# Patient Record
Sex: Male | Born: 1995
Health system: Southern US, Community
[De-identification: ages and names within clinical notes are randomized; demographics above are authoritative.]

## PROBLEM LIST (undated history)

## (undated) ENCOUNTER — Emergency Department (HOSPITAL_COMMUNITY): Admission: EM | Payer: BLUE CROSS/BLUE SHIELD | Source: Home / Self Care

## (undated) DIAGNOSIS — J45909 Unspecified asthma, uncomplicated: Secondary | ICD-10-CM

---
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
## 2016-12-03 ENCOUNTER — Encounter (HOSPITAL_COMMUNITY): Payer: Self-pay | Admitting: Emergency Medicine

## 2016-12-03 ENCOUNTER — Emergency Department (HOSPITAL_COMMUNITY)
Admission: EM | Admit: 2016-12-03 | Discharge: 2016-12-03 | Disposition: A | Discharge status: Home / Self Care | Payer: BLUE CROSS/BLUE SHIELD | Attending: Emergency Medicine | Admitting: Emergency Medicine

## 2016-12-03 DIAGNOSIS — J45909 Unspecified asthma, uncomplicated: Secondary | ICD-10-CM | POA: Insufficient documentation

## 2016-12-03 DIAGNOSIS — Z79899 Other long term (current) drug therapy: Secondary | ICD-10-CM | POA: Diagnosis not present

## 2016-12-03 DIAGNOSIS — M6282 Rhabdomyolysis: Secondary | ICD-10-CM | POA: Diagnosis not present

## 2016-12-03 DIAGNOSIS — R319 Hematuria, unspecified: Secondary | ICD-10-CM | POA: Diagnosis present

## 2016-12-03 HISTORY — DX: Unspecified asthma, uncomplicated: J45.909

## 2016-12-03 LAB — URINALYSIS, ROUTINE W REFLEX MICROSCOPIC
Glucose, UA: 100 mg/dL — AB
Ketones, ur: 15 mg/dL — AB
NITRITE: POSITIVE — AB
Specific Gravity, Urine: 1.01 (ref 1.005–1.030)
pH: 7 (ref 5.0–8.0)

## 2016-12-03 LAB — BASIC METABOLIC PANEL
Anion gap: 13 (ref 5–15)
BUN: 10 mg/dL (ref 6–20)
CO2: 21 mmol/L — ABNORMAL LOW (ref 22–32)
Calcium: 9.3 mg/dL (ref 8.9–10.3)
Chloride: 101 mmol/L (ref 101–111)
Creatinine, Ser: 1.58 mg/dL — ABNORMAL HIGH (ref 0.61–1.24)
GFR calc Af Amer: >60 mL/min (ref >60)
Glucose, Bld: 109 mg/dL — ABNORMAL HIGH (ref 65–99)
POTASSIUM: 4.1 mmol/L (ref 3.5–5.1)
SODIUM: 135 mmol/L (ref 135–145)

## 2016-12-03 LAB — CBC
HEMATOCRIT: 40.5 % (ref 39.0–52.0)
Hemoglobin: 13.3 g/dL (ref 13.0–17.0)
MCH: 23.9 pg — ABNORMAL LOW (ref 26.0–34.0)
MCHC: 32.8 g/dL (ref 30.0–36.0)
MCV: 72.7 fL — ABNORMAL LOW (ref 78.0–100.0)
PLATELETS: 234 10*3/uL (ref 150–400)
RBC: 5.57 MIL/uL (ref 4.22–5.81)
RDW: 15.6 % — AB (ref 11.5–15.5)
WBC: 11.7 10*3/uL — AB (ref 4.0–10.5)

## 2016-12-03 LAB — URINALYSIS, MICROSCOPIC (REFLEX)

## 2016-12-03 LAB — CK
Total CK: 3079 U/L — ABNORMAL HIGH (ref 49–397)
Total CK: 2937 U/L — ABNORMAL HIGH (ref 49–397)

## 2016-12-03 MED ORDER — SODIUM CHLORIDE 0.9 % IV BOLUS (SEPSIS)
1000.0000 mL | Freq: Once | INTRAVENOUS | Status: AC
  Administered 2016-12-03: 1000 mL via INTRAVENOUS

## 2016-12-03 NOTE — ED Notes (Signed)
Spoke with lab, they will add the CK to blood already in lab.

## 2016-12-03 NOTE — ED Notes (Signed)
Patient denies pain and is resting comfortably.  

## 2016-12-03 NOTE — ED Triage Notes (Signed)
Pt c/o blood in urine x 2 days. Denies abdominal pain, frequency, pain with urination or discharge.

## 2016-12-03 NOTE — ED Provider Notes (Signed)
[facility]   CSN: [id number] Arrival date & time: 12/03/16  0051     History   Chief Complaint Chief Complaint  Patient presents with  . Hematuria    HPI Henry RusselJakeem Violett is a 21 y.o. male.  Patient presents to the emergency department for evaluation of blood in his urine.  He reports that he just noticed the blood tonight.  Patient reports starting a new exercise regimen this week.  He has been running each of the days this week.  He reports the first couple of days he noticed that his urine was dark yellow, today there appeared to be blood in it.  When he runs he sometimes gets pain on his right side, but no continuous flank pain.  No nausea, vomiting.  Currently he is well without complaints.  He did not notice any urinary frequency or dysuria.  No urethral discharge.      Past Medical History:  Diagnosis Date  . Asthma     There are no active problems to display for this patient.   History reviewed. No pertinent surgical history.     Home Medications    Prior to Admission medications   Medication Sig Start Date End Date Taking? Authorizing Provider  albuterol (PROVENTIL HFA;VENTOLIN HFA) 108 (90 BASE) MCG/ACT inhaler Inhale 1-2 puffs into the lungs every 6 (six) hours as needed for wheezing or shortness of breath.   Yes [provider]    Family History No family history on file.  Social History Social History  Substance Use Topics  . Smoking status: Never Smoker  . Smokeless tobacco: Not on file  . Alcohol use Yes     Allergies   Fruit & vegetable daily [nutritional supplements]   Review of Systems Review of Systems  Genitourinary: Positive for hematuria. Negative for dysuria and frequency.  All other systems reviewed and are negative.    Physical Exam Updated Vital Signs BP 112/66   Pulse 77   Temp 97.6 F (36.4 C) (Oral)   Resp 16   SpO2 100%   Physical Exam   Constitutional: He is oriented to person, place, and time. He appears well-developed and well-nourished. No distress.  HENT:  Head: Normocephalic and atraumatic.  Right Ear: Hearing normal.  Left Ear: Hearing normal.  Nose: Nose normal.  Mouth/Throat: Oropharynx is clear and moist and mucous membranes are normal.  Eyes: Pupils are equal, round, and reactive to light. Conjunctivae and EOM are normal.  Neck: Normal range of motion. Neck supple.  Cardiovascular: Regular rhythm, S1 normal and S2 normal.  Exam reveals no gallop and no friction rub.   No murmur heard. Pulmonary/Chest: Effort normal and breath sounds normal. No respiratory distress. He exhibits no tenderness.  Abdominal: Soft. Normal appearance and bowel sounds are normal. There is no hepatosplenomegaly. There is no tenderness. There is no rebound, no guarding, no tenderness at McBurney's point and negative Murphy's sign. No hernia.  Musculoskeletal: Normal range of motion.  Neurological: He is alert and oriented to person, place, and time. He has normal strength. No cranial nerve deficit or sensory deficit. Coordination normal. GCS eye subscore is 4. GCS verbal subscore is 5. GCS motor subscore is 6.  Skin: Skin is warm, dry and intact. No rash noted. No cyanosis.  Psychiatric: He has a normal mood and affect. His speech is normal and behavior is normal. Thought content normal.  Nursing note and vitals reviewed.    ED Treatments /  Results  Labs (all labs ordered are listed, but only abnormal results are displayed) Labs Reviewed  URINALYSIS, ROUTINE W REFLEX MICROSCOPIC - Abnormal; Notable for the following:       Result Value   Color, Urine RED (*)    APPearance CLOUDY (*)    Glucose, UA 100 (*)    Hgb urine dipstick LARGE (*)    Bilirubin Urine LARGE (*)    Ketones, ur 15 (*)    Protein, ur >300 (*)    Nitrite POSITIVE (*)    Leukocytes, UA MODERATE (*)    All other components within normal limits  BASIC METABOLIC  PANEL - Abnormal; Notable for the following:    CO2 21 (*)    Glucose, Bld 109 (*)    Creatinine, Ser 1.58 (*)    All other components within normal limits  CBC - Abnormal; Notable for the following:    WBC 11.7 (*)    MCV 72.7 (*)    MCH 23.9 (*)    RDW 15.6 (*)    All other components within normal limits  URINALYSIS, MICROSCOPIC (REFLEX) - Abnormal; Notable for the following:    Bacteria, UA FEW (*)    Squamous Epithelial / LPF PRESENT (*)    All other components within normal limits  CK - Abnormal; Notable for the following:    Total CK 3,079 (*)    All other components within normal limits  CK - Abnormal; Notable for the following:    Total CK 2,937 (*)    All other components within normal limits  GC/CHLAMYDIA PROBE AMP () NOT AT [facility]    EKG  EKG Interpretation None       Radiology No results found.  Procedures Procedures (including critical care time)  Medications Ordered in ED Medications  sodium chloride 0.9 % bolus 1,000 mL (0 mLs Intravenous Stopped 12/03/16 0347)    Followed by  sodium chloride 0.9 % bolus 1,000 mL (0 mLs Intravenous Stopped 12/03/16 0515)  sodium chloride 0.9 % bolus 1,000 mL (1,000 mLs Intravenous New Bag/Given 12/03/16 0515)     Initial Impression / Assessment and Plan / ED Course  I have reviewed the triage vital signs and the nursing notes.  Pertinent labs & imaging results that were available during my care of the patient were reviewed by me and considered in my medical decision making (see chart for details).     Patient resents to the ER with concerns over possible blood in his urine.  Initial urinalysis showed large blood but no red blood cells, which raise concern for rhabdomyolysis.  Further discussion with the patient does reveal that he has been actively exercising this week, running in preparation for some events.  CPK was ordered and it did reveal a total CPK of 3,079.  He did have slight elevation of his  creatinine at 1.58.  Patient was given 2 L of normal saline solution and repeat CPK was 2,937.  Patient is tolerating oral intake.  As his rhabdomyolysis is not increasing.  It is felt that he can continue oral hydration at home.  He was, however, given 1/3 L of normal saline solution prior to discharge.  Patient was counseled not to exercise for 1 week.  Final Clinical Impressions(s) / ED Diagnoses   Final diagnoses:  Non-traumatic rhabdomyolysis    New Prescriptions New Prescriptions   No medications on file     Gilda Crease, MD 12/03/16 [phone]

## 2016-12-03 NOTE — Discharge Instructions (Signed)
No exercise for 1 week.  Increase your oral intake of fluid over the next week.  If you have any severe muscle pain or decrease in your urine output, return to the ER immediately.

## 2017-01-06 DIAGNOSIS — Z23 Encounter for immunization: Secondary | ICD-10-CM | POA: Diagnosis not present

## 2017-02-23 ENCOUNTER — Encounter (INDEPENDENT_AMBULATORY_CARE_PROVIDER_SITE_OTHER): Payer: Self-pay | Admitting: *Deleted

## 2017-02-23 ENCOUNTER — Telehealth: Payer: Self-pay | Admitting: Pharmacist

## 2017-02-23 DIAGNOSIS — Z006 Encounter for examination for normal comparison and control in clinical research program: Secondary | ICD-10-CM

## 2017-02-23 NOTE — Progress Notes (Signed)
Jeremiah Torres was here today [facility] see [facility] He currently is in a monogamous relationship with his partner and did not meet any of the other criteria to qualify for the study. He was referred to the PrEP clinic and Cassie scheduled him an appointment for next week. .Marland Kitchen

## 2017-02-23 NOTE — Telephone Encounter (Signed)
Jeremiah SnareJakeem came in with his partner today to be screened for our HPTN PrEP study.  They are in a monogomous relationship, so they do not qualify.  I was asked to speak to them about PrEP.  I discussed our PrEP program here with them.  They had to get to class, so they will come back next Tuesday ~11am to start the process and get labs. They are both insured.

## 2017-02-23 NOTE — Telephone Encounter (Signed)
Thanks again Cassie!

## 2017-03-02 ENCOUNTER — Ambulatory Visit: Payer: BLUE CROSS/BLUE SHIELD

## 2017-09-06 ENCOUNTER — Encounter (HOSPITAL_COMMUNITY): Payer: Self-pay | Admitting: Emergency Medicine

## 2017-09-06 ENCOUNTER — Ambulatory Visit (HOSPITAL_COMMUNITY)
Admission: EM | Admit: 2017-09-06 | Discharge: 2017-09-06 | Disposition: A | Discharge status: Home / Self Care | Payer: Self-pay | Attending: Family Medicine | Admitting: Family Medicine

## 2017-09-06 DIAGNOSIS — R0602 Shortness of breath: Secondary | ICD-10-CM | POA: Insufficient documentation

## 2017-09-06 DIAGNOSIS — J45909 Unspecified asthma, uncomplicated: Secondary | ICD-10-CM | POA: Insufficient documentation

## 2017-09-06 DIAGNOSIS — R0789 Other chest pain: Secondary | ICD-10-CM | POA: Insufficient documentation

## 2017-09-06 DIAGNOSIS — Z79899 Other long term (current) drug therapy: Secondary | ICD-10-CM | POA: Insufficient documentation

## 2017-09-06 DIAGNOSIS — Z7983 Long term (current) use of bisphosphonates: Secondary | ICD-10-CM | POA: Insufficient documentation

## 2017-09-06 DIAGNOSIS — R001 Bradycardia, unspecified: Secondary | ICD-10-CM | POA: Insufficient documentation

## 2017-09-06 DIAGNOSIS — R002 Palpitations: Secondary | ICD-10-CM | POA: Insufficient documentation

## 2017-09-06 LAB — POCT I-STAT, CHEM 8
BUN: 10 mg/dL (ref 6–20)
CHLORIDE: 102 mmol/L (ref 98–111)
Calcium, Ion: 1.19 mmol/L (ref 1.15–1.40)
Creatinine, Ser: 1.2 mg/dL (ref 0.61–1.24)
Glucose, Bld: 105 mg/dL — ABNORMAL HIGH (ref 70–99)
HCT: 46 % (ref 39.0–52.0)
HEMOGLOBIN: 15.6 g/dL (ref 13.0–17.0)
Potassium: 3.9 mmol/L (ref 3.5–5.1)
Sodium: 141 mmol/L (ref 135–145)
TCO2: 27 mmol/L (ref 22–32)

## 2017-09-06 LAB — TSH: TSH: 0.69 u[IU]/mL (ref 0.350–4.500)

## 2017-09-06 MED ORDER — RANITIDINE HCL 150 MG PO CAPS: 150.0000 mg | ORAL_CAPSULE | Freq: Every day | ORAL | 0 refills | Status: AC

## 2017-09-06 NOTE — ED Triage Notes (Signed)
PT reports for 3 weeks he has has intermittent SOB, chest pain, and palipitations. Happens most often when he lays down. No chest pain at this time.

## 2017-09-06 NOTE — ED Notes (Signed)
Pt asked if he could leave and go to urgent care. I told him that I cannot advise him to leave. Pt looked up nearest urgent care and stated he was leaving.

## 2017-09-06 NOTE — ED Provider Notes (Signed)
[facility]    CSN: [id number] Arrival date & time: 09/06/17  1932     History   Chief Complaint Chief Complaint  Patient presents with  . Shortness of Breath    HPI Jeremiah Torres is a 22 y.o. male.   22 year old male with history of asthma comes in for 3 week history of intermittent shortness of breath, chest pain, palpitations.  States symptoms can happen separately, or altogether, can last anywhere from 5 to 30 minutes.  States chest pain location is different every time, can be right chest, left lower chest, no obvious pattern.  Denies any aggravating or alleviating factor, though seems to notice symptoms more when lying down.  Patient states has history of exercise/exertional induced asthma, but has not had increased activity, and has not felt like he needed his albuterol inhaler.  He has mild nasal congestion without rhinorrhea, cough, sore throat.  Denies fever, chills, night sweats.  Occasional nausea with abdominal pain or vomiting.  Does experience acid reflux symptoms including burning sensation to the abdomen that can radiate up to chest.  Denies weakness, dizziness, syncope.  Patient wonders if symptoms could be due to anxiety, as currently increased stress in life including work, school, finances.  He denies caffeine use, alcohol use, illicit drug use.  Never smoker. Denies current chest pain.      Past Medical History:  Diagnosis Date  . Asthma     There are no active problems to display for this patient.   History reviewed. No pertinent surgical history.     Home Medications    Prior to Admission medications   Medication Sig Start Date End Date Taking? Authorizing Provider  albuterol (PROVENTIL HFA;VENTOLIN HFA) 108 (90 BASE) MCG/ACT inhaler Inhale 1-2 puffs into the lungs every 6 (six) hours as needed for wheezing or shortness of breath.    [provider]  ranitidine (ZANTAC) 150 MG capsule Take 1 capsule (150 mg total) by mouth daily.  09/06/17   Belinda FisherYu, Amy V, PA-C    Family History No family history on file.  Social History Social History   Tobacco Use  . Smoking status: Never Smoker  Substance Use Topics  . Alcohol use: Yes  . Drug use: No     Allergies   Fruit & vegetable daily [nutritional supplements]   Review of Systems Review of Systems  Reason unable to perform ROS: See HPI as above.     Physical Exam Triage Vital Signs ED Triage Vitals  Enc Vitals Group     BP 09/06/17 1956 139/77     Pulse Rate 09/06/17 1956 78     Resp 09/06/17 1956 16     Temp 09/06/17 1956 98.4 F (36.9 C)     Temp Source 09/06/17 1956 Oral     SpO2 09/06/17 1956 100 %     Weight 09/06/17 1957 197 lb (89.4 kg)     Height --      Head Circumference --      Peak Flow --      Pain Score 09/06/17 1957 5     Pain Loc --      Pain Edu? --      Excl. in GC? --    No data found.  Updated Vital Signs BP 139/77   Pulse 78   Temp 98.4 F (36.9 C) (Oral)   Resp 16   Wt 197 lb (89.4 kg)   SpO2 100%   BMI 25.99 kg/m  Physical Exam  Constitutional: He is oriented to person, place, and time. He appears well-developed and well-nourished.  Non-toxic appearance. He does not appear ill. No distress.  Patient speaking in full sentences without difficulty.  HENT:  Head: Normocephalic and atraumatic.  Right Ear: Tympanic membrane, external ear and ear canal normal. Tympanic membrane is not erythematous and not bulging.  Left Ear: Tympanic membrane, external ear and ear canal normal. Tympanic membrane is not erythematous and not bulging.  Nose: Nose normal. Right sinus exhibits no maxillary sinus tenderness and no frontal sinus tenderness. Left sinus exhibits no maxillary sinus tenderness and no frontal sinus tenderness.  Mouth/Throat: Uvula is midline, oropharynx is clear and moist and mucous membranes are normal.  Eyes: Pupils are equal, round, and reactive to light. Conjunctivae are normal.  Neck: Normal range of motion.  Neck supple.  Cardiovascular: Normal rate, regular rhythm and normal heart sounds. Exam reveals no gallop and no friction rub.  No murmur heard. Pulmonary/Chest: Effort normal and breath sounds normal. No accessory muscle usage. No respiratory distress. He has no decreased breath sounds. He has no wheezes. He has no rhonchi. He has no rales. He exhibits no tenderness.  No tenderness to palpation of chest wall.   Abdominal: Soft. Bowel sounds are normal. He exhibits no distension. There is no tenderness. There is no rebound and no guarding.  Musculoskeletal:       Right lower leg: He exhibits no edema.       Left lower leg: He exhibits no edema.  Lymphadenopathy:    He has no cervical adenopathy.  Neurological: He is alert and oriented to person, place, and time.  Skin: Skin is warm and dry.  Psychiatric: He has a normal mood and affect. His behavior is normal. Judgment normal.   UC Treatments / Results  Labs (all labs ordered are listed, but only abnormal results are displayed) Labs Reviewed  POCT I-STAT, CHEM 8 - Abnormal; Notable for the following components:      Result Value   Glucose, Bld 105 (*)    All other components within normal limits  TSH    EKG None  Radiology No results found.  Procedures Procedures (including critical care time)  Medications Ordered in UC Medications - No data to display  Initial Impression / Assessment and Plan / UC Course  I have reviewed the triage vital signs and the nursing notes.  Pertinent labs & imaging results that were available during my care of the patient were reviewed by me and considered in my medical decision making (see chart for details).    No alarming signs on exam.  EKG sinus bradycardia, beats per minute, no acute ST changes.  I-STAT within normal limits.  TSH pending.  Will have patient start Zantac for possible acid reflux causing symptoms.  Otherwise follow-up with PCP for further evaluation and management.  Return  precautions given.  Patient expresses understanding and agrees to plan.  Patient discharged in stable condition, pending TSH result.  Case discussed with Dr Delton See, who agrees to plan.  Final Clinical Impressions(s) / UC Diagnoses   Final diagnoses:  Atypical chest pain  Shortness of breath  Palpitations    ED Prescriptions    Medication Sig Dispense Auth. Provider   ranitidine (ZANTAC) 150 MG capsule Take 1 capsule (150 mg total) by mouth daily. 15 capsule Threasa Alpha, [place] 09/06/17 2117

## 2017-09-06 NOTE — Discharge Instructions (Addendum)
No alarming signs on exam. Your EKG and lab work was normal. Please start zantac for acid reflux, which sometimes can cause some of these symptoms. Keep hydrated, your urine should be clear to pale yellow in color. Follow up with PCP for further workup if symptoms continues. If experiencing worsening symptoms, worsening chest pain/shortness of breath, weakness, dizziness, passing out, one sided leg swelling, go to the emergency department for further evaluation needed.

## 2017-10-25 ENCOUNTER — Encounter (HOSPITAL_COMMUNITY): Payer: Self-pay

## 2017-10-25 ENCOUNTER — Ambulatory Visit (HOSPITAL_COMMUNITY)
Admission: EM | Admit: 2017-10-25 | Discharge: 2017-10-25 | Disposition: A | Discharge status: Home / Self Care | Payer: Self-pay | Attending: Family Medicine | Admitting: Family Medicine

## 2017-10-25 DIAGNOSIS — J45909 Unspecified asthma, uncomplicated: Secondary | ICD-10-CM | POA: Insufficient documentation

## 2017-10-25 DIAGNOSIS — Z79899 Other long term (current) drug therapy: Secondary | ICD-10-CM | POA: Insufficient documentation

## 2017-10-25 DIAGNOSIS — Z113 Encounter for screening for infections with a predominantly sexual mode of transmission: Secondary | ICD-10-CM | POA: Insufficient documentation

## 2017-10-25 NOTE — Discharge Instructions (Signed)
We are screening you for gonorrhea, chlamydia, trichomonas, HIV and syphilis These results should come back the end of the week and you should expect a call from us if anything is abnormal  Please follow-up if developing symptoms or any concerns

## 2017-10-25 NOTE — ED Triage Notes (Signed)
Pt presents for STD Testing, no complaints of any symptoms.

## 2017-10-25 NOTE — ED Provider Notes (Signed)
[facility]    CSN: [id number] Arrival date & time: 10/25/17  1700     History   Chief Complaint Chief Complaint  Patient presents with  . STD Testing    HPI Jeremiah Torres is a 21 y.o. male history of asthma presenting today for STD screening.  Patient states that he participates in male male sexual intercourse and would like to be screened for STDs.  He does not have any known exposures.  He denies any symptoms.  Denies penile discharge, dysuria, abdominal pain, fever, nausea, vomiting.  Denies any rashes.  Denies coughing or chest discomfort.  Denies sore throat.  Requesting swab rectally and orally.  HPI  Past Medical History:  Diagnosis Date  . Asthma     There are no active problems to display for this patient.   History reviewed. No pertinent surgical history.     Home Medications    Prior to Admission medications   Medication Sig Start Date End Date Taking? Authorizing Provider  albuterol (PROVENTIL HFA;VENTOLIN HFA) 108 (90 BASE) MCG/ACT inhaler Inhale 1-2 puffs into the lungs every 6 (six) hours as needed for wheezing or shortness of breath.    [provider]  ranitidine (ZANTAC) 150 MG capsule Take 1 capsule (150 mg total) by mouth daily. 09/06/17   Belinda Fisher, PA-C    Family History History reviewed. No pertinent family history.  Social History Social History   Tobacco Use  . Smoking status: Never Smoker  Substance Use Topics  . Alcohol use: Yes  . Drug use: No     Allergies   Fruit & vegetable daily [nutritional supplements]   Review of Systems Review of Systems  Constitutional: Negative for fever.  HENT: Negative for sore throat.   Respiratory: Negative for shortness of breath.   Cardiovascular: Negative for chest pain.  Gastrointestinal: Negative for abdominal pain, nausea and vomiting.  Genitourinary: Negative for difficulty urinating, discharge, dysuria, frequency, penile pain, penile swelling, scrotal swelling and  testicular pain.  Skin: Negative for rash.  Neurological: Negative for dizziness, light-headedness and headaches.     Physical Exam Triage Vital Signs ED Triage Vitals  Enc Vitals Group     BP 10/25/17 1720 (!) 151/68     Pulse Rate 10/25/17 1720 60     Resp 10/25/17 1720 20     Temp 10/25/17 1720 98 F (36.7 C)     Temp Source 10/25/17 1720 Oral     SpO2 10/25/17 1720 100 %     Weight --      Height --      Head Circumference --      Peak Flow --      Pain Score 10/25/17 1719 0     Pain Loc --      Pain Edu? --      Excl. in GC? --    No data found.  Updated Vital Signs BP (!) 151/68 (BP Location: Right Arm)   Pulse 60   Temp 98 F (36.7 C) (Oral)   Resp 20   SpO2 100%   Visual Acuity Right Eye Distance:   Left Eye Distance:   Bilateral Distance:    Right Eye Near:   Left Eye Near:    Bilateral Near:     Physical Exam  Constitutional: He is oriented to person, place, and time. He appears well-developed and well-nourished.  No acute distress  HENT:  Head: Normocephalic and atraumatic.  Nose: Nose normal.  Eyes: Conjunctivae are  normal.  Neck: Neck supple.  Cardiovascular: Normal rate.  Pulmonary/Chest: Effort normal. No respiratory distress.  Breathing comfortably at rest  Abdominal: Soft. He exhibits no distension. There is no tenderness.  Nontender to light deep palpation throughout abdomen  Musculoskeletal: Normal range of motion.  Neurological: He is alert and oriented to person, place, and time.  Skin: Skin is warm and dry.  Psychiatric: He has a normal mood and affect.  Nursing note and vitals reviewed.    UC Treatments / Results  Labs (all labs ordered are listed, but only abnormal results are displayed) Labs Reviewed  HIV ANTIBODY (ROUTINE TESTING W REFLEX)  RPR  URINE CYTOLOGY ANCILLARY ONLY  CYTOLOGY, (ORAL, ANAL, URETHRAL) ANCILLARY ONLY  CYTOLOGY, (ORAL, ANAL, URETHRAL) ANCILLARY ONLY    EKG None  Radiology No results  found.  Procedures Procedures (including critical care time)  Medications Ordered in UC Medications - No data to display  Initial Impression / Assessment and Plan / UC Course  I have reviewed the triage vital signs and the nursing notes.  Pertinent labs & imaging results that were available during my care of the patient were reviewed by me and considered in my medical decision making (see chart for details).     We will screen patient for HIV, syphilis, will screen for GC's/chlamydia/trichomonas with urine, rectal swab and oral swab.  Will call patient with results and provide treatment if needed.  Will defer treatment until results given patient asymptomatic.Discussed strict return precautions. Patient verbalized understanding and is agreeable with plan.  Final Clinical Impressions(s) / UC Diagnoses   Final diagnoses:  Screen for STD (sexually transmitted disease)     Discharge Instructions     We are screening you for gonorrhea, chlamydia, trichomonas, HIV and syphilis These results should come back the end of the week and you should expect a call from us if anything is abnormal  Please follow-up if developing symptoms or any concerns   ED Prescriptions    None     Controlled Substance Prescriptions [name] Controlled Substance Registry consulted? Not Applicable   Lew DawesWieters, Barry Culverhouse C, [place]PA-C 10/25/17 1806

## 2017-10-26 LAB — CYTOLOGY, (ORAL, ANAL, URETHRAL) ANCILLARY ONLY
Chlamydia: NEGATIVE
Chlamydia: NEGATIVE
Neisseria Gonorrhea: NEGATIVE
Neisseria Gonorrhea: NEGATIVE
TRICH (WINDOWPATH): NEGATIVE
TRICH (WINDOWPATH): NEGATIVE

## 2017-10-26 LAB — HIV ANTIBODY (ROUTINE TESTING W REFLEX): HIV SCREEN 4TH GENERATION: NONREACTIVE

## 2017-10-26 LAB — RPR: RPR: NONREACTIVE

## 2017-10-26 LAB — URINE CYTOLOGY ANCILLARY ONLY
CHLAMYDIA, DNA PROBE: NEGATIVE
NEISSERIA GONORRHEA: NEGATIVE
Trichomonas: NEGATIVE

## 2018-04-07 ENCOUNTER — Ambulatory Visit: Payer: Self-pay | Admitting: Physician Assistant

## 2018-04-07 NOTE — Progress Notes (Deleted)
Jeremiah Torres is a 22 y.o. male here to Establish Care.  I acted as a Neurosurgeon for [employer], PA-C [employer], LPN  History of Present Illness:   No chief complaint on file.   Acute Concerns:   Chronic Issues:   Health Maintenance: Immunizations -- *** Colonoscopy -- *** Mammogram -- *** PAP -- *** Bone Density -- *** PSA -- *** Diet -- *** Caffeine intake -- *** Sleep habits -- *** Exercise -- *** Weight --    Mood -- *** Alcohol -- *** Tobacco -- ***  No flowsheet data found.  No flowsheet data found.   Other providers/specialists: Patient Care Team: System, Provider Not In as PCP - General   Past Medical History:  Diagnosis Date  . Asthma      Social History   Socioeconomic History  . Marital status: Single    Spouse name: Not on file  . Number of children: Not on file  . Years of education: Not on file  . Highest education level: Not on file  Occupational History  . Not on file  Social Needs  . Financial resource strain: Not on file  . Food insecurity:    Worry: Not on file    Inability: Not on file  . Transportation needs:    Medical: Not on file    Non-medical: Not on file  Tobacco Use  . Smoking status: Never Smoker  Substance and Sexual Activity  . Alcohol use: Yes  . Drug use: No  . Sexual activity: Not on file  Lifestyle  . Physical activity:    Days per week: Not on file    Minutes per session: Not on file  . Stress: Not on file  Relationships  . Social connections:    Talks on phone: Not on file    Gets together: Not on file    Attends religious service: Not on file    Active member of club or organization: Not on file    Attends meetings of clubs or organizations: Not on file    Relationship status: Not on file  . Intimate partner violence:    Fear of current or ex partner: Not on file    Emotionally abused: Not on file    Physically abused: Not on file    Forced sexual activity: Not on file  Other Topics  Concern  . Not on file  Social History Narrative  . Not on file    No past surgical history on file.  No family history on file.  Allergies  Allergen Reactions  . Fruit & Vegetable Daily [Nutritional Supplements] Anaphylaxis and Hives    "Strawberries"     Current Medications:   Current Outpatient Medications:  .  albuterol (PROVENTIL HFA;VENTOLIN HFA) 108 (90 BASE) MCG/ACT inhaler, Inhale 1-2 puffs into the lungs every 6 (six) hours as needed for wheezing or shortness of breath., Disp: , Rfl:  .  ranitidine (ZANTAC) 150 MG capsule, Take 1 capsule (150 mg total) by mouth daily., Disp: 15 capsule, Rfl: 0   Review of Systems:   ROS  Vitals:   There were no vitals filed for this visit.    There is no height or weight on file to calculate BMI.  Physical Exam:   Physical Exam  Results for orders placed or performed during the hospital encounter of 10/25/17  HIV Antibody (routine testing w rflx)  Result Value Ref Range   HIV Screen 4th Generation wRfx Non Reactive Non Reactive  RPR  Result Value Ref Range   RPR Ser Ql Non Reactive Non Reactive  Urine cytology ancillary only  Result Value Ref Range   Chlamydia Negative    Neisseria gonorrhea Negative    Trichomonas Negative   Cytology (oral, anal, urethral) ancillary only  Result Value Ref Range   Chlamydia Negative    Neisseria gonorrhea Negative    Trichomonas Negative   Cytology (oral, anal, urethral) ancillary only  Result Value Ref Range   Chlamydia Negative    Neisseria gonorrhea Negative    Trichomonas Negative     Assessment and Plan:   There are no diagnoses linked to this encounter.  . Reviewed expectations re: course of current medical issues. . Discussed self-management of symptoms. . Outlined signs and symptoms indicating need for more acute intervention. . Patient verbalized understanding and all questions were answered. . See orders for this visit as documented in the electronic medical  record. . Patient received an After-Visit Summary.  ***  Jarold Motto, PA-C

## 2018-04-27 ENCOUNTER — Other Ambulatory Visit: Payer: Self-pay

## 2018-04-27 ENCOUNTER — Ambulatory Visit (INDEPENDENT_AMBULATORY_CARE_PROVIDER_SITE_OTHER): Payer: BLUE CROSS/BLUE SHIELD | Admitting: Family Medicine

## 2018-04-27 ENCOUNTER — Encounter: Payer: Self-pay | Admitting: Family Medicine

## 2018-04-27 ENCOUNTER — Other Ambulatory Visit (HOSPITAL_COMMUNITY)
Admission: RE | Admit: 2018-04-27 | Discharge: 2018-04-27 | Disposition: A | Discharge status: Home / Self Care | Payer: BLUE CROSS/BLUE SHIELD | Source: Ambulatory Visit | Attending: Family Medicine | Admitting: Family Medicine

## 2018-04-27 VITALS — BP 130/80 | HR 67 | Temp 98.6°F | Ht 72.0 in | Wt 188.4 lb

## 2018-04-27 DIAGNOSIS — Z0000 Encounter for general adult medical examination without abnormal findings: Secondary | ICD-10-CM | POA: Diagnosis not present

## 2018-04-27 DIAGNOSIS — Z91018 Allergy to other foods: Secondary | ICD-10-CM | POA: Diagnosis not present

## 2018-04-27 DIAGNOSIS — Z113 Encounter for screening for infections with a predominantly sexual mode of transmission: Secondary | ICD-10-CM

## 2018-04-27 DIAGNOSIS — R7301 Impaired fasting glucose: Secondary | ICD-10-CM

## 2018-04-27 DIAGNOSIS — Z23 Encounter for immunization: Secondary | ICD-10-CM | POA: Diagnosis not present

## 2018-04-27 LAB — BASIC METABOLIC PANEL
BUN: 13 mg/dL (ref 6–23)
CO2: 29 mEq/L (ref 19–32)
Calcium: 9.6 mg/dL (ref 8.4–10.5)
Chloride: 104 mEq/L (ref 96–112)
Creatinine, Ser: 1.08 mg/dL (ref 0.40–1.50)
GFR: 102.93 mL/min (ref >60.00)
GLUCOSE: 103 mg/dL — AB (ref 70–99)
POTASSIUM: 4.4 meq/L (ref 3.5–5.1)
SODIUM: 139 meq/L (ref 135–145)

## 2018-04-27 LAB — LIPID PANEL
CHOLESTEROL: 161 mg/dL (ref 0–200)
HDL: 52.1 mg/dL (ref >39.00)
LDL Cholesterol: 96 mg/dL (ref 0–99)
NONHDL: 108.45
Total CHOL/HDL Ratio: 3
Triglycerides: 60 mg/dL (ref 0.0–149.0)
VLDL: 12 mg/dL (ref 0.0–40.0)

## 2018-04-27 LAB — ALT: ALT: 12 U/L (ref 0–53)

## 2018-04-27 LAB — AST: AST: 17 U/L (ref 0–37)

## 2018-04-27 NOTE — Patient Instructions (Signed)

## 2018-04-27 NOTE — Progress Notes (Signed)
Jeremiah Torres is a 23 y.o. male  Chief Complaint  Patient presents with  . Establish Care    est care/ CPE-- fasting/ referral to allergist?/ wants flu shot    HPI: Jeremiah Torres is a 23 y.o. male here as a new patient to establish care with our office and for his annual CPE, fasting labs. He also requests STI screening, no known exposure or symptoms.  Pt would like a flu vaccine today. Pt also requests referral to allergy. He has testing in 2013 or 2014 for his food allergies (strawberries, peas) as well as environmental allergy to oak trees. He would like to possibly be retested especially for his food allergies to see if he has "outgrown" them.    Past Medical History:  Diagnosis Date  . Asthma     No past surgical history on file.  Social History   Socioeconomic History  . Marital status: Single    Spouse name: Not on file  . Number of children: Not on file  . Years of education: Not on file  . Highest education level: Not on file  Occupational History  . Not on file  Social Needs  . Financial resource strain: Not on file  . Food insecurity:    Worry: Not on file    Inability: Not on file  . Transportation needs:    Medical: Not on file    Non-medical: Not on file  Tobacco Use  . Smoking status: Never Smoker  . Smokeless tobacco: Never Used  . Tobacco comment: second hand smoke  Substance and Sexual Activity  . Alcohol use: Yes    Comment: socially  . Drug use: No  . Sexual activity: Not on file  Lifestyle  . Physical activity:    Days per week: Not on file    Minutes per session: Not on file  . Stress: Not on file  Relationships  . Social connections:    Talks on phone: Not on file    Gets together: Not on file    Attends religious service: Not on file    Active member of club or organization: Not on file    Attends meetings of clubs or organizations: Not on file    Relationship status: Not on file  . Intimate partner violence:    Fear of  current or ex partner: Not on file    Emotionally abused: Not on file    Physically abused: Not on file    Forced sexual activity: Not on file  Other Topics Concern  . Not on file  Social History Narrative  . Not on file    Family History  Problem Relation Age of Onset  . Hypertension Father   . Diabetes Maternal Grandmother      Immunization History  Administered Date(s) Administered  . Tdap 04/09/2017    Outpatient Encounter Medications as of 04/27/2018  Medication Sig  . albuterol (PROVENTIL HFA;VENTOLIN HFA) 108 (90 BASE) MCG/ACT inhaler Inhale 1-2 puffs into the lungs every 6 (six) hours as needed for wheezing or shortness of breath.  . EPINEPHrine 0.3 mg/0.3 mL IJ SOAJ injection Inject into the muscle.  . ranitidine (ZANTAC) 150 MG capsule Take 1 capsule (150 mg total) by mouth daily.   No facility-administered encounter medications on file as of 04/27/2018.      ROS: Gen: no fever, chills  Skin: no rash, itching ENT: no ear pain, ear drainage, nasal congestion, rhinorrhea, sinus pressure, sore throat Eyes: no blurry vision, double  vision Resp: no cough, wheeze,SOB CV: no CP, palpitations, LE edema,  GI: no heartburn, n/v/d/c, abd pain GU: no dysuria, urgency, frequency, hematuria; no testicular swelling or masses MSK: no joint pain, myalgias, back pain Neuro: no dizziness, headache, weakness Psych: no depression, anxiety, insomnia   Allergies  Allergen Reactions  . Fruit & Vegetable Daily [Nutritional Supplements] Anaphylaxis and Hives    "Strawberries"  . [facility]  [Quercus Robur]   . Pea Rash  . Strawberry Extract Rash    BP 130/80   Pulse 67   Temp 98.6 F (37 C) (Oral)   Ht 6' (1.829 m)   Wt 188 lb 6.4 oz (85.5 kg)   SpO2 97%   BMI 25.55 kg/m   Physical Exam  Constitutional: He is oriented to person, place, and time. He appears well-developed and well-nourished. No distress.  HENT:  Head: Normocephalic and atraumatic.  Right Ear: Tympanic  membrane and ear canal normal.  Left Ear: Tympanic membrane and ear canal normal.  Nose: Nose normal.  Mouth/Throat: Oropharynx is clear and moist and mucous membranes are normal.  Neck: Neck supple. No thyromegaly present.  Cardiovascular: Normal rate, regular rhythm and normal heart sounds.  No murmur heard. Pulmonary/Chest: Effort normal and breath sounds normal. No respiratory distress. He has no wheezes. He has no rhonchi.  Abdominal: Soft. Bowel sounds are normal. He exhibits no distension and no mass. There is no abdominal tenderness.  Genitourinary:    Genitourinary Comments: Pr declines testicular/GU exam    Musculoskeletal: Normal range of motion.        General: No edema.  Lymphadenopathy:    He has no cervical adenopathy.  Neurological: He is alert and oriented to person, place, and time. Coordination normal.  Skin: Skin is warm and dry.  Psychiatric: He has a normal mood and affect. His behavior is normal.     A/P:  1. Annual physical exam - UTD on dental and vision exams - UTD on immunizations - discussed importance of regular CV exercise, healthy diet, adequate sleep, safe sex practices - ALT - AST - Basic metabolic panel - Lipid panel - next CPE in 1 year or sooner PRN  2. Screening for STD (sexually transmitted disease) - HIV Antibody (routine testing w rflx) - Hepatitis B surface antigen - RPR - Urine cytology ancillary only([place]) - Hepatitis C Antibody  3. Multiple food allergies - Ambulatory referral to Allergy

## 2018-04-27 NOTE — Addendum Note (Signed)
Addended by: Francine Graven on: 04/27/2018 02:33 PM   Modules accepted: Orders

## 2018-04-28 ENCOUNTER — Encounter: Payer: Self-pay | Admitting: Allergy & Immunology

## 2018-04-28 ENCOUNTER — Ambulatory Visit (INDEPENDENT_AMBULATORY_CARE_PROVIDER_SITE_OTHER): Payer: BLUE CROSS/BLUE SHIELD | Admitting: Allergy & Immunology

## 2018-04-28 VITALS — BP 122/84 | HR 72 | Temp 98.1°F | Resp 16 | Ht 72.0 in | Wt 188.0 lb

## 2018-04-28 DIAGNOSIS — J302 Other seasonal allergic rhinitis: Secondary | ICD-10-CM | POA: Diagnosis not present

## 2018-04-28 DIAGNOSIS — T781XXD Other adverse food reactions, not elsewhere classified, subsequent encounter: Secondary | ICD-10-CM | POA: Diagnosis not present

## 2018-04-28 DIAGNOSIS — J3089 Other allergic rhinitis: Secondary | ICD-10-CM | POA: Diagnosis not present

## 2018-04-28 DIAGNOSIS — J310 Chronic rhinitis: Secondary | ICD-10-CM

## 2018-04-28 DIAGNOSIS — J45990 Exercise induced bronchospasm: Secondary | ICD-10-CM

## 2018-04-28 LAB — URINE CYTOLOGY ANCILLARY ONLY
Chlamydia: NEGATIVE
NEISSERIA GONORRHEA: NEGATIVE
Trichomonas: NEGATIVE

## 2018-04-28 LAB — RPR: RPR: NONREACTIVE

## 2018-04-28 LAB — HEPATITIS C ANTIBODY
Hepatitis C Ab: NONREACTIVE
SIGNAL TO CUT-OFF: 0.15 (ref <1.00)

## 2018-04-28 LAB — HIV ANTIBODY (ROUTINE TESTING W REFLEX): HIV: NONREACTIVE

## 2018-04-28 LAB — HEPATITIS B SURFACE ANTIGEN: Hepatitis B Surface Ag: NONREACTIVE

## 2018-04-28 MED ORDER — EPINEPHRINE 0.3 MG/0.3ML IJ SOAJ: 0.3000 mg | Freq: Once | INTRAMUSCULAR | 2 refills | Status: AC | PRN

## 2018-04-28 MED ORDER — ALBUTEROL SULFATE HFA 108 (90 BASE) MCG/ACT IN AERS: 1.0000 | INHALATION_SPRAY | Freq: Four times a day (QID) | RESPIRATORY_TRACT | 1 refills | Status: AC | PRN

## 2018-04-28 NOTE — Progress Notes (Signed)
NEW PATIENT  Date of Service/Encounter:  04/28/18  Referring provider: Overton Mam, DO   Assessment:   Exercise-induced bronchospasm  Chronic rhinitis  Adverse food reaction   Jeremiah Torres presents for an allergy evaluation.  Unfortunately, his positive control is nonreactive today making interpretation of his testing impossible.  We will confirm this with blood testing today.  We are only going to be testing for English pea and strawberry since these are the ones that he is actually reacted to.  In the meantime, we are going to continue with albuterol as needed for exercise-induced bronchospasm.  I also recommended continuing with cetirizine as needed for his environmental allergies.  We are going to refill his epinephrine autoinjector and we filled out an anaphylaxis management plan.   Plan/Recommendations:   1. Exercise-induced bronchospasm - We did not do lung testing at all since your symptoms were so well controlled. - Continue with albuterol 2 puffs prior to physical activity or as needed.  - There is no need for a controller medication at this time.  2. Seasonal and perennial allergic rhinitis - Testing to the entire panel was negative, even the positive control (which should have reacted). - Therefore we are going to get some bloodwork to confirm this. - Continue with cetirizine  as needed.   3. Adverse food reaction - We will get blood testing for strawberry and English pea. - We will call you in 1-2 weeks with the results of the testing. - We will send in a prescription for AuviQ (epinephrine), which should be free to you. - They will call you in 1-2 days to confirm your shipping address.   4. Return in about 1 year (around 04/28/2019).  Subjective:   Jeremiah Torres is a 23 y.o. male presenting today for evaluation of  Chief Complaint  Patient presents with  . Food Intolerance    Jeremiah Torres has a history of the following: There are no active  problems to display for this patient.   History obtained from: chart review and patient.  [name] was referred by Milton Ferguson     Jeremiah Torres is a 23 y.o. male presenting for an evaluation of environmental allergies and food allergies.   Asthma/Respiratory Symptom History: He has a history of exercise-induced bronchospasm.  He has albuterol to use as needed.  Typically his albuterol MDIs expire. He has not needed any prednisone since the last visit.   Allergic Rhinitis Symptom History: He reports that he has itchy watery eyes and runny nose during the spring and fall season.  He takes Zyrtec as needed for his symptoms.  He does not have a nose spray.  He first had issues with hives when he was younger.  He was cutting a tree with his dad and developed hives over his entire body.  He treated this with Benadryl.  This is the episode that led him to get allergy tested.  He did grow up with dogs as a child but had no problems with them.  He does not get antibiotics for environmental allergy symptoms.  Food Allergy Symptom History: He reports an allergy to English peas and strawberries.  English peas ingestion causes hives on the face.  He has throat swelling with strawberries.  He does have an EpiPen although is not sure it is up-to-date.  He also wants to be tested for the most common foods as well, although is not entirely clear why.  He does not like peanuts or tree nuts  at all.   He does have a history of anxiety treated with therapy only.  He has never been on a medication.  Otherwise, there is no history of other atopic diseases, including drug allergies, stinging insect allergies, eczema, urticaria or contact dermatitis. There is no significant infectious history. Vaccinations are up to date.    Past Medical History: There are no active problems to display for this patient.   Medication List:  Allergies as of 04/28/2018      Reactions   Fruit & Vegetable Daily [nutritional  Supplements] Anaphylaxis, Hives   "Strawberries"   Oak Bark  [quercus Robur]    Pea Rash   Strawberry Extract Rash      Medication List       Accurate as of April 28, 2018 10:25 AM. Always use your most recent med list.        albuterol 108 (90 Base) MCG/ACT inhaler Commonly known as:  PROVENTIL HFA;VENTOLIN HFA Inhale 1-2 puffs into the lungs every 6 (six) hours as needed for wheezing or shortness of breath.   EPINEPHrine 0.3 mg/0.3 mL Soaj injection Commonly known as:  EPI-PEN Inject 0.3 mLs (0.3 mg total) into the muscle once as needed for up to 1 dose for anaphylaxis.   ranitidine 150 MG capsule Commonly known as:  ZANTAC Take 1 capsule (150 mg total) by mouth daily.       Birth History: non-contributory  Developmental History: non-contributory.   Past Surgical History: History reviewed. No pertinent surgical history.  [place] Family History: Family History  Problem Relation Age of Onset  . Hypertension Father   . Diabetes Maternal Grandmother   . Asthma Brother      Social History: Desai lives in an apartment without animals.  He is unsure how old the apartment is.  There is carpeting throughout the apartment.  He has electric heating and central cooling.  He does not have dust mite covers on the bedding.  He is not exposed to tobacco exposure.  He currently works at [employer] and attends school at [employer].  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  All of his classes now are online due to the coronavirus.  He has never been a smoker.  Review of Systems  Constitutional: Negative.  Negative for fever, malaise/fatigue and weight loss.  HENT: Negative.  Negative for congestion, ear discharge and ear pain.   Eyes: Negative for pain, discharge and redness.  Respiratory: Negative for cough, sputum production, shortness of breath and wheezing.   Cardiovascular: Negative.  Negative for chest pain and  palpitations.  Gastrointestinal: Negative for abdominal pain and heartburn.  Skin: Negative.  Negative for itching and rash.  Neurological: Negative for dizziness and headaches.  Endo/Heme/Allergies: Negative for environmental allergies. Does not bruise/bleed easily.       Objective:   Blood pressure 122/84, pulse 72, temperature 98.1 F (36.7 C), temperature source Oral, resp. rate 16, height 6' (1.829 m), weight 188 lb (85.3 kg), SpO2 98 %. Body mass index is 25.5 kg/m.   Physical Exam:   Physical Exam  Constitutional: He appears well-developed and well-nourished.  Pleasant male.  HENT:  Head: Normocephalic and atraumatic.  Right Ear: Tympanic membrane, external ear and ear canal normal. No drainage, swelling or tenderness. Tympanic membrane is not injected, not scarred, not erythematous, not retracted and not bulging.  Left Ear: Tympanic membrane, external ear and ear canal normal. No drainage,  swelling or tenderness. Tympanic membrane is not injected, not scarred, not erythematous, not retracted and not bulging.  Nose: Mucosal edema present. No rhinorrhea, nasal deformity or septal deviation. No epistaxis. Right sinus exhibits no maxillary sinus tenderness and no frontal sinus tenderness. Left sinus exhibits no maxillary sinus tenderness and no frontal sinus tenderness.  Mouth/Throat: Uvula is midline and oropharynx is clear and moist. Mucous membranes are not pale and not dry.  Mild cobblestoning in the posterior oropharynx.  Eyes: Pupils are equal, round, and reactive to light. Conjunctivae and EOM are normal. Right eye exhibits no chemosis and no discharge. Left eye exhibits no chemosis and no discharge. Right conjunctiva is not injected. Left conjunctiva is not injected.  Cardiovascular: Normal rate, regular rhythm and normal heart sounds.  Respiratory: Effort normal and breath sounds normal. No accessory muscle usage. No tachypnea. No respiratory distress. He has no wheezes.  He has no rhonchi. He has no rales. He exhibits no tenderness.  Moving air well in all lung fields.  GI: There is no abdominal tenderness. There is no rebound and no guarding.  Lymphadenopathy:       Head (right side): No submandibular, no tonsillar and no occipital adenopathy present.       Head (left side): No submandibular, no tonsillar and no occipital adenopathy present.    He has no cervical adenopathy.  Neurological: He is alert.  Skin: No abrasion, no petechiae and no rash noted. Rash is not papular, not vesicular and not urticarial. No erythema. No pallor.  No eczema or urticarial lesions.  Psychiatric: He has a normal mood and affect.     Diagnostic studies:   Allergy Studies:    Airborne Adult Perc - 04/28/18 0904    Time Antigen Placed  3419    Allergen Manufacturer  Waynette Buttery    Location  Back    Number of Test  59    Panel 1  Select    1. Control-Buffer 50% Glycerol  Negative    2. Control-Histamine 1 mg/ml  Negative    3. Albumin saline  Negative    4. Bahia  Negative    5. French [place]  Negative    6. Johnson  Negative    7. Kentucky Blue  Negative    8. Meadow Fescue  Negative    9. Perennial Rye  Negative    10. Sweet Vernal  Negative    11. Timothy  Negative    12. Cocklebur  Negative    13. Burweed Marshelder  Negative    14. Ragweed, short  Negative    15. Ragweed, Giant  Negative    16. Plantain,  English  Negative    17. Lamb's Quarters  Negative    18. Sheep Sorrell  Negative    19. Rough Pigweed  Negative    20. Marsh Elder, Rough  Negative    21. Mugwort, Common  Negative    22. Ash mix  Negative    23. Birch mix  Negative    24. Beech American  Negative    25. Box, Elder  Negative    26. Cedar, red  Negative    27. Cottonwood, Guinea-Bissau  Negative    28. Elm mix  Negative    29. Hickory mix  Negative    30. Maple mix  Negative    31. Oak, Guinea-[place]issau mix  Negative    32. Pecan Pollen  Negative    33. Pine mix  Negative    34.  Sycamore Eastern   Negative    35. Walnut, Black Pollen  Negative    36. Alternaria alternata  Negative    37. Cladosporium Herbarum  Negative    38. Aspergillus mix  Negative    39. Penicillium mix  Negative    40. Bipolaris sorokiniana (Helminthosporium)  Negative    41. Drechslera spicifera (Curvularia)  Negative    42. Mucor plumbeus  Negative    43. Fusarium moniliforme  Negative    44. Aureobasidium pullulans (pullulara)  Negative    45. Rhizopus oryzae  Negative    46. Botrytis cinera  Negative    47. Epicoccum nigrum  Negative    48. Phoma betae  Negative    49. Candida Albicans  Negative    50. Trichophyton mentagrophytes  Negative    51. Mite, D Farinae  5,000 AU/ml  Negative    52. Mite, D Pteronyssinus  5,000 AU/ml  Negative    53. Cat Hair 10,000 BAU/ml  Negative    54.  Dog Epithelia  Negative    55. Mixed Feathers  Negative    56. Horse Epithelia  Negative    57. Cockroach, German  Negative    58. Mouse  Negative    59. Tobacco Leaf  Negative     Food Perc - 04/28/18 0904    Time Antigen Placed  1610    Allergen Manufacturer  Waynette Buttery    Location  Back    Number of allergen test  10    Food  Select    1. Peanut  Negative    2. Soybean food  Negative    3. Wheat, whole  Negative    4. Sesame  Negative    5. Milk, cow  Negative    6. Egg White, chicken  Negative    7. Casein  Negative    8. Shellfish mix  Negative    9. Fish mix  Negative    10. Cashew  Negative       Allergy testing results were read and interpreted by myself, documented by clinical staff.         Malachi Bonds, MD [facility]

## 2018-04-28 NOTE — Patient Instructions (Addendum)
1. Exercise-induced bronchospasm - We did not do lung testing at all since your symptoms were so well controlled. - Continue with albuterol 2 puffs prior to physical activity or as needed.  - There is no need for a controller medication at this time.  2. Seasonal and perennial allergic rhinitis - Testing to the entire panel was negative, even the positive control (which should have reacted). - Therefore we are going to get some bloodwork to confirm this. - Continue with cetirizine 10mg  as needed.   3. Adverse food reaction - We will get blood testing for strawberry and English pea. - We will call you in 1-2 weeks with the results of the testing. - We will send in a prescription for AuviQ (epinephrine), which should be free to you. - They will call you in 1-2 days to confirm your shipping address.   4. Return in about 1 year (around 04/28/2019).   Please inform us of any Emergency Department visits, hospitalizations, or changes in symptoms. Call us before going to the ED for breathing or allergy symptoms since we might be able to fit you in for a sick visit. Feel free to contact us anytime with any questions, problems, or concerns.  It was a pleasure to meet you today!  Websites that have reliable patient information: 1. American Academy of Asthma, Allergy, and Immunology: www.aaaai.org 2. Food Allergy Research and Education (FARE): foodallergy.org 3. Mothers of Asthmatics: http://www.asthmacommunitynetwork.org 4. American College of Allergy, Asthma, and Immunology: www.acaai.org  "Like" [place] on Facebook and Instagram for our latest updates!      Make sure you are registered to vote! If you have moved or changed any of your contact information, you will need to get this updated before voting!       Voter ID laws are NOT going into effect for the General Election in November 2020! DO NOT let this stop you from exercising your right to vote!

## 2018-04-28 NOTE — Addendum Note (Signed)
Addended by: Overton Mam on: 04/28/2018 07:51 AM   Modules accepted: Orders

## 2018-04-30 LAB — ALLERGENS W/TOTAL IGE AREA 2
Alternaria Alternata IgE: <0.1 kU/L
Aspergillus Fumigatus IgE: <0.1 kU/L
Bermuda Grass IgE: <0.1 kU/L
Cat Dander IgE: <0.1 kU/L
Cedar, Mountain IgE: <0.1 kU/L
Cladosporium Herbarum IgE: <0.1 kU/L
Cockroach, German IgE: <0.1 kU/L
Common Silver Birch IgE: <0.1 kU/L
Cottonwood IgE: <0.1 kU/L
D Pteronyssinus IgE: <0.1 kU/L
Dog Dander IgE: <0.1 kU/L
Elm, American IgE: <0.1 kU/L
IGE (IMMUNOGLOBULIN E), SERUM: 20 [IU]/mL (ref 6–495)
Johnson Grass IgE: <0.1 kU/L
Maple/Box Elder IgE: <0.1 kU/L
Mouse Urine IgE: <0.1 kU/L
Oak, White IgE: <0.1 kU/L
Pecan, Hickory IgE: <0.1 kU/L
Penicillium Chrysogen IgE: <0.1 kU/L
Pigweed, Rough IgE: <0.1 kU/L
Ragweed, Short IgE: <0.1 kU/L
Sheep Sorrel IgE Qn: <0.1 kU/L
White Mulberry IgE: <0.1 kU/L

## 2018-04-30 LAB — ALLERGEN, STRAWBERRY, F44: Allergen Strawberry IgE: <0.1 kU/L

## 2018-04-30 LAB — ALLERGEN PEA F12: Allergen Green Pea IgE: <0.1 kU/L
